# Patient Record
Sex: Female | Born: 2000 | Race: White | Hispanic: No | Marital: Single | Smoking: Never smoker
Health system: Southern US, Community
[De-identification: ages and names within clinical notes are randomized; demographics above are authoritative.]

---

## 2020-01-21 DIAGNOSIS — J36 Peritonsillar abscess: Secondary | ICD-10-CM

## 2020-01-21 HISTORY — DX: Peritonsillar abscess: J36

## 2020-03-18 ENCOUNTER — Emergency Department: Payer: Medicaid - Out of State

## 2020-03-18 ENCOUNTER — Encounter: Payer: Self-pay | Admitting: Emergency Medicine

## 2020-03-18 ENCOUNTER — Emergency Department
Admission: EM | Admit: 2020-03-18 | Discharge: 2020-03-18 | Disposition: A | Discharge status: Home / Self Care | Payer: Medicaid - Out of State | Attending: Emergency Medicine | Admitting: Emergency Medicine

## 2020-03-18 ENCOUNTER — Other Ambulatory Visit: Payer: Self-pay

## 2020-03-18 DIAGNOSIS — J36 Peritonsillar abscess: Secondary | ICD-10-CM | POA: Diagnosis not present

## 2020-03-18 DIAGNOSIS — Z20822 Contact with and (suspected) exposure to covid-19: Secondary | ICD-10-CM | POA: Insufficient documentation

## 2020-03-18 DIAGNOSIS — J029 Acute pharyngitis, unspecified: Secondary | ICD-10-CM | POA: Diagnosis present

## 2020-03-18 DIAGNOSIS — R059 Cough, unspecified: Secondary | ICD-10-CM

## 2020-03-18 LAB — COMPREHENSIVE METABOLIC PANEL
ALT: 17 U/L (ref 0–44)
AST: 20 U/L (ref 15–41)
Albumin: 4.8 g/dL (ref 3.5–5.0)
Alkaline Phosphatase: 95 U/L (ref 38–126)
Anion gap: 12 (ref 5–15)
BUN: 5 mg/dL — ABNORMAL LOW (ref 6–20)
CO2: 25 mmol/L (ref 22–32)
Calcium: 9.8 mg/dL (ref 8.9–10.3)
Chloride: 101 mmol/L (ref 98–111)
Creatinine, Ser: 0.75 mg/dL (ref 0.44–1.00)
GFR, Estimated: >60 mL/min (ref >60)
Glucose, Bld: 126 mg/dL — ABNORMAL HIGH (ref 70–99)
Potassium: 3.4 mmol/L — ABNORMAL LOW (ref 3.5–5.1)
Sodium: 138 mmol/L (ref 135–145)
Total Bilirubin: 1 mg/dL (ref 0.3–1.2)
Total Protein: 8.5 g/dL — ABNORMAL HIGH (ref 6.5–8.1)

## 2020-03-18 LAB — CBC WITH DIFFERENTIAL/PLATELET
Abs Immature Granulocytes: 0.16 10*3/uL — ABNORMAL HIGH (ref 0.00–0.07)
Basophils Absolute: 0.1 10*3/uL (ref 0.0–0.1)
Basophils Relative: 0 %
Eosinophils Absolute: 0.1 10*3/uL (ref 0.0–0.5)
Eosinophils Relative: 1 %
HCT: 43.5 % (ref 36.0–46.0)
Hemoglobin: 14.5 g/dL (ref 12.0–15.0)
Immature Granulocytes: 1 %
Lymphocytes Relative: 10 %
Lymphs Abs: 1.8 10*3/uL (ref 0.7–4.0)
MCH: 30.5 pg (ref 26.0–34.0)
MCHC: 33.3 g/dL (ref 30.0–36.0)
MCV: 91.6 fL (ref 80.0–100.0)
Monocytes Absolute: 2 10*3/uL — ABNORMAL HIGH (ref 0.1–1.0)
Monocytes Relative: 11 %
Neutro Abs: 14.8 10*3/uL — ABNORMAL HIGH (ref 1.7–7.7)
Neutrophils Relative %: 77 %
Platelets: 316 10*3/uL (ref 150–400)
RBC: 4.75 MIL/uL (ref 3.87–5.11)
RDW: 12.5 % (ref 11.5–15.5)
WBC: 19 10*3/uL — ABNORMAL HIGH (ref 4.0–10.5)
nRBC: 0 % (ref 0.0–0.2)

## 2020-03-18 LAB — GROUP A STREP BY PCR: Group A Strep by PCR: DETECTED — AB

## 2020-03-18 LAB — RESP PANEL BY RT-PCR (FLU A&B, COVID) ARPGX2
Influenza A by PCR: NEGATIVE
Influenza B by PCR: NEGATIVE
SARS Coronavirus 2 by RT PCR: NEGATIVE

## 2020-03-18 LAB — HCG, QUANTITATIVE, PREGNANCY: hCG, Beta Chain, Quant, S: <1 m[IU]/mL (ref <5)

## 2020-03-18 IMAGING — CT CT NECK W/ CM
4 series · 14 of 33 positions shown, 17 images · IV contrast (omnipaque)
Comparison: None.

CLINICAL DATA: Epiglottitis or tonsillitis suspected, pain swelling
left peritonsillar

EXAM:
CT NECK WITH CONTRAST
TECHNIQUE: Multidetector CT imaging of the neck was performed using the
standard protocol following the bolus administration of intravenous
contrast.
CONTRAST:  75mL OMNIPAQUE IOHEXOL 300 MG/ML  SOLN

[Series 2: axial neck · axial · 0.51mm/px · z∈[-266,-108]mm · 5 of 119 slices shown, 7 images]
[im 20/119  soft-tissue]
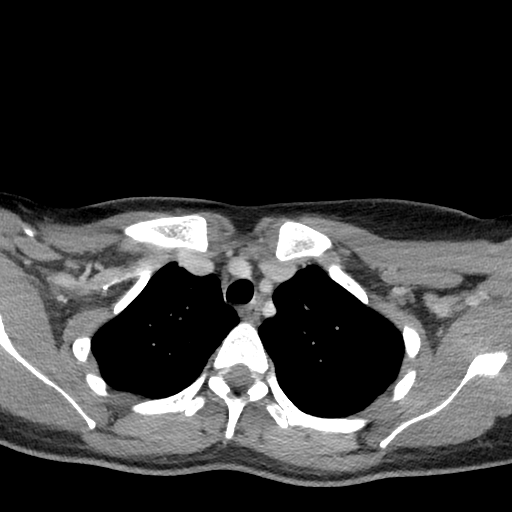
[im 20/119  bone]
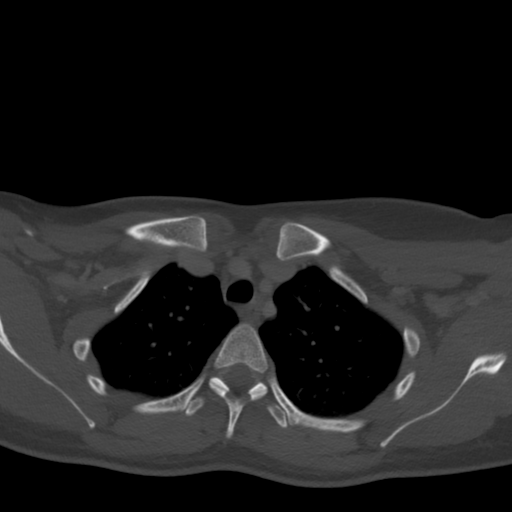
[im 40/119  bone]
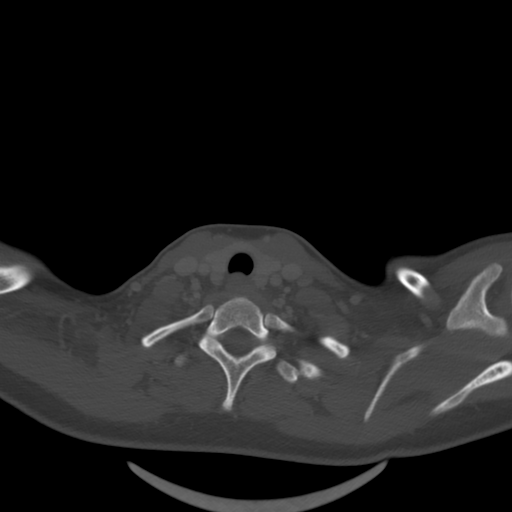
[im 60/119  bone]
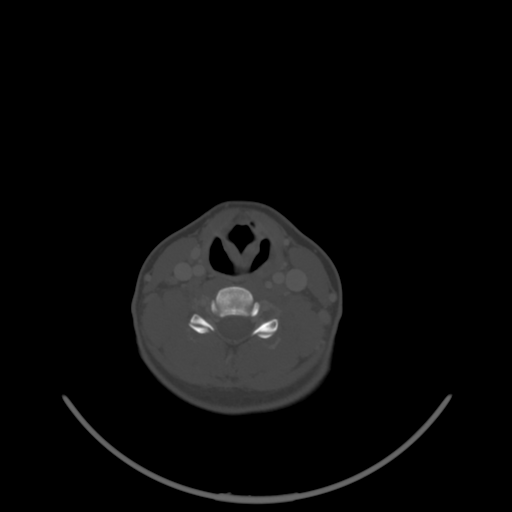
[im 79/119  bone]
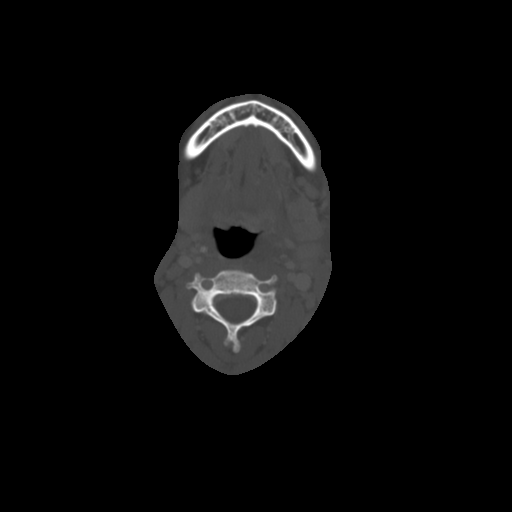
[im 99/119  soft-tissue]
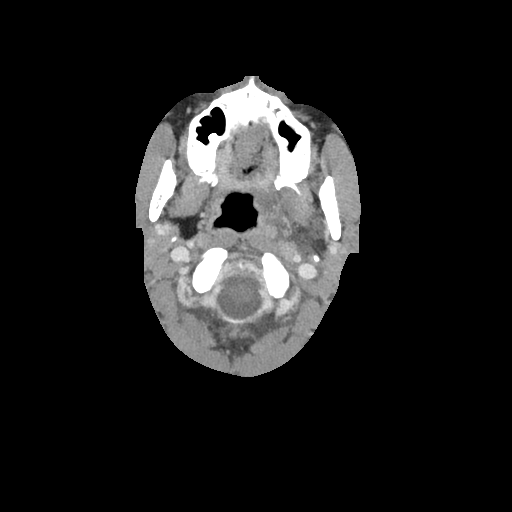
[im 99/119  bone]
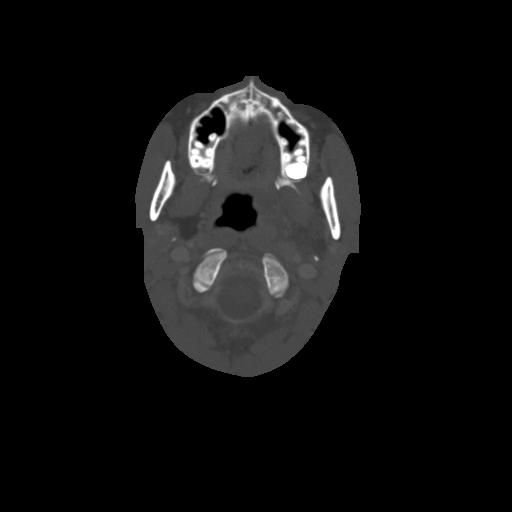

[Series 5: sag neck · sagittal · 0.42mm/px · 5 of 114 slices shown, 6 images]
[im 38/114  bone]
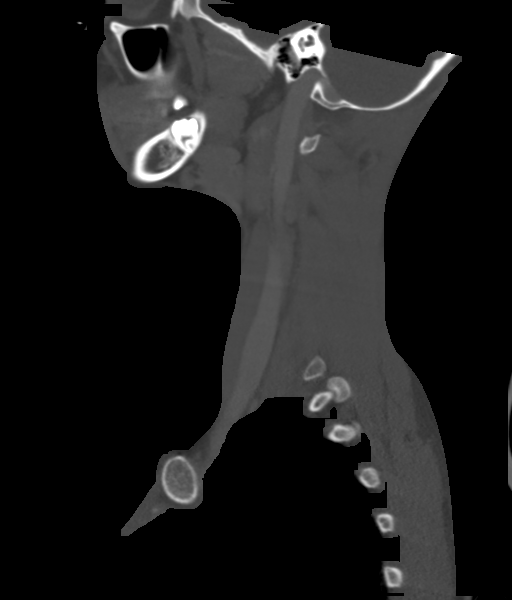
[im 48/114  bone]
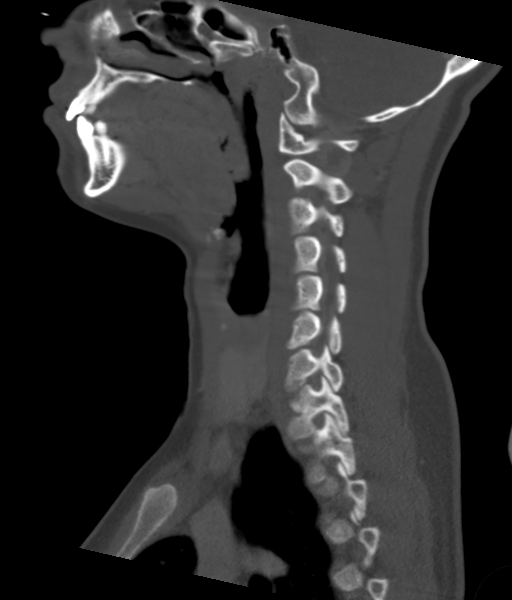
[im 57/114  soft-tissue]
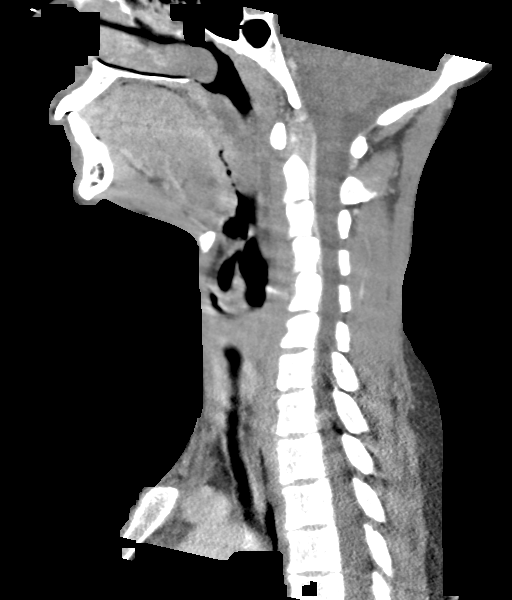
[im 57/114  bone]
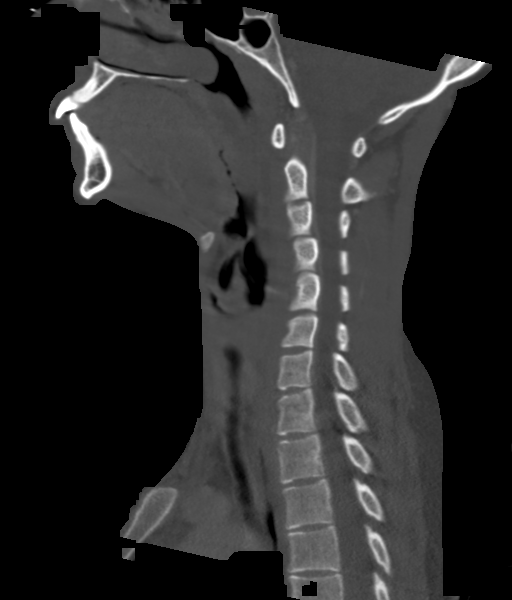
[im 66/114  bone]
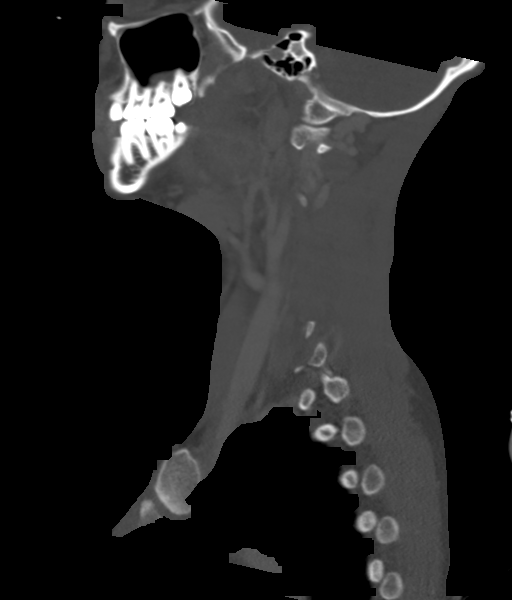
[im 76/114  bone]
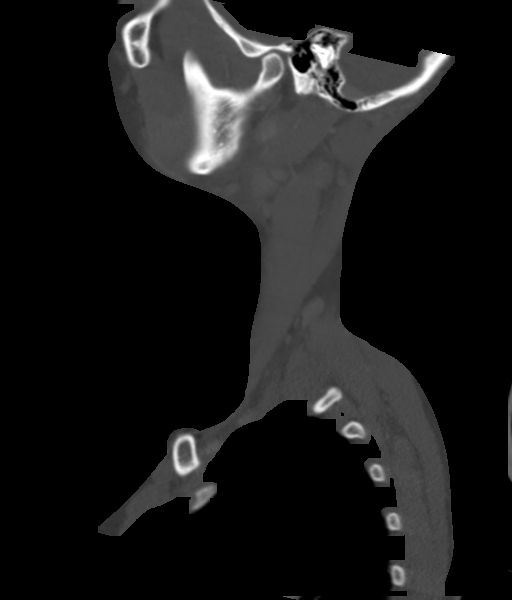

[Series 6: cor neck · coronal · 0.44mm/px · 3 of 91 slices shown]
[im 19/91  bone]
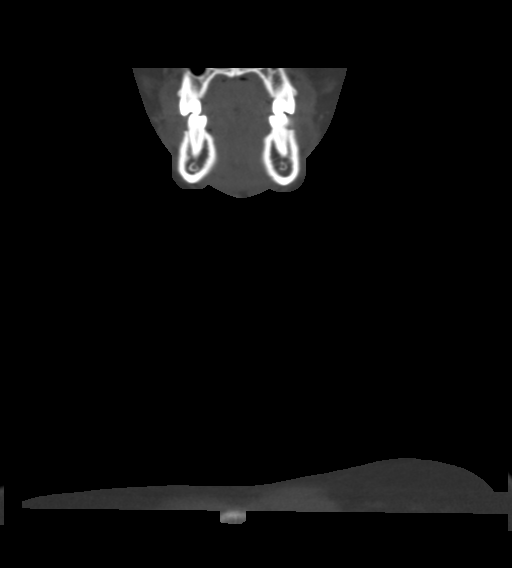
[im 37/91  bone]
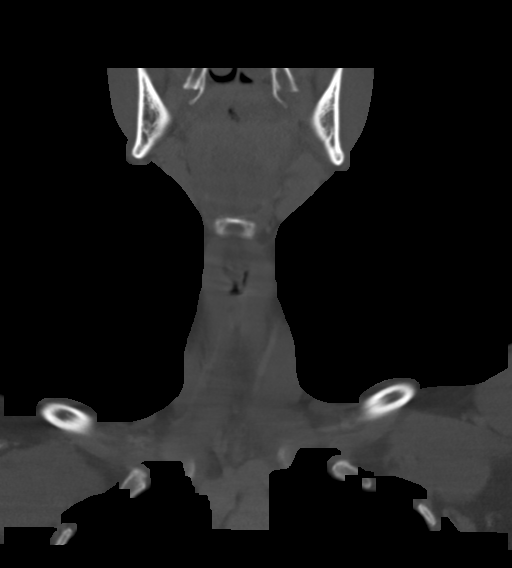
[im 55/91  bone]
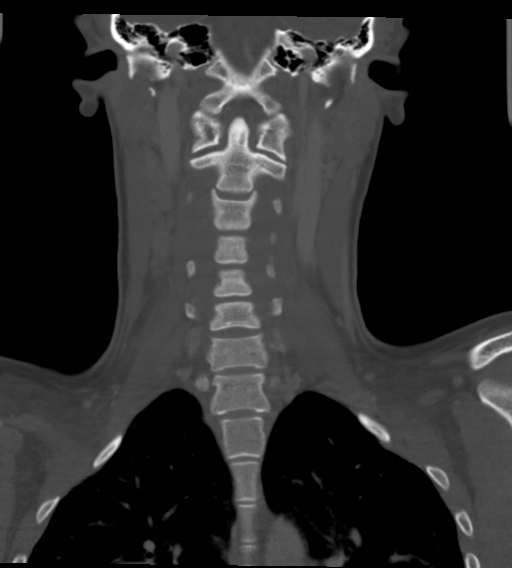

[Series 7: orthogonal ax · axial · 0.35mm/px · 1 of 130 slices shown]
[im 22/130  bone]
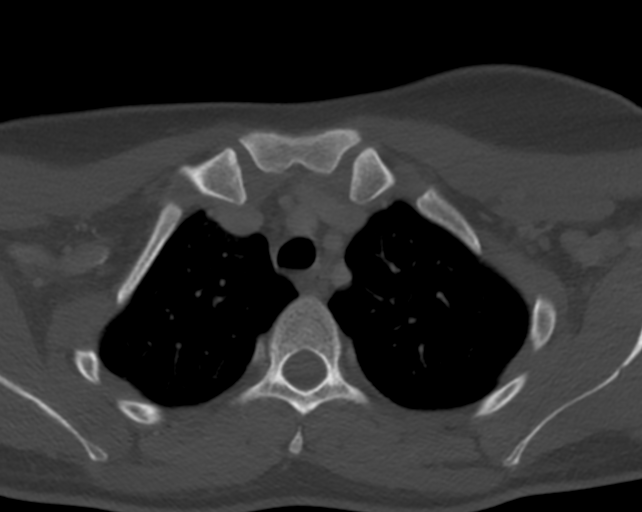

[14 of 33 positions shown; findings below may reference images not displayed]

FINDINGS: Pharynx and larynx: Mild asymmetric prominence/edema involving the
left tubal tonsil and adenoid. Left pharyngeal wall edema. The left
palatine tonsil is enlarged and edematous with an associated abscess
measuring 1.2 x 1.0 x 1.8 cm. Mild left parapharyngeal fat
inflammatory changes. Mild narrowing of the oropharyngeal airway.

Patent vallecula and piriform sinuses. No evidence of vocal cord
paralysis. Normal epiglottis.

Salivary glands: Normal appearance of the parotid and submandibular
glands. Left neck inflammatory changes underlying the platysma
muscle

Thyroid: Normal.

Lymph nodes: Asymmetrically enlarged left cervical nodes measuring
up to 1.3 x 1.9 cm in the 2A region.

Vascular: Normal intravascular enhancement.

Limited intracranial: Grossly unremarkable.

Visualized orbits: Normal orbits.

Mastoids and visualized paranasal sinuses: Left maxillary sinus
mucous retention cyst. No mastoid effusion.

Skeleton: No acute or suspicious osseous abnormalities.

Upper chest: Clear lung apices.

Other: None.
IMPRESSION: Left palatine tonsillitis with 1.8 cm peritonsillar abscess.

Mild reactive inflammatory changes involving the left adenoid and
tubal tonsil.

Left pharyngeal wall/neck edema and left cervical adenopathy, likely
reactive.

## 2020-03-18 MED ORDER — CLINDAMYCIN HCL 150 MG PO CAPS: 450.0000 mg | ORAL_CAPSULE | Freq: Four times a day (QID) | ORAL | 0 refills | Status: AC

## 2020-03-18 MED ORDER — BENZOCAINE 20 % MT SOLN
Freq: Once | OROMUCOSAL | Status: AC
  Filled 2020-03-18: qty 30

## 2020-03-18 MED ORDER — MORPHINE SULFATE (PF) 2 MG/ML IV SOLN
2.0000 mg | Freq: Once | INTRAVENOUS | Status: AC
  Administered 2020-03-18: 2 mg via INTRAVENOUS
  Filled 2020-03-18: qty 1

## 2020-03-18 MED ORDER — SODIUM CHLORIDE 0.9 % IV BOLUS
1000.0000 mL | Freq: Once | INTRAVENOUS | Status: AC
  Administered 2020-03-18: 1000 mL via INTRAVENOUS

## 2020-03-18 MED ORDER — CLINDAMYCIN PHOSPHATE 600 MG/50ML IV SOLN
600.0000 mg | Freq: Once | INTRAVENOUS | Status: AC
  Administered 2020-03-18: 600 mg via INTRAVENOUS
  Filled 2020-03-18: qty 50

## 2020-03-18 MED ORDER — KETOROLAC TROMETHAMINE 30 MG/ML IJ SOLN
15.0000 mg | Freq: Once | INTRAMUSCULAR | Status: AC
  Administered 2020-03-18: 15 mg via INTRAVENOUS
  Filled 2020-03-18: qty 1

## 2020-03-18 MED ORDER — DEXAMETHASONE SODIUM PHOSPHATE 10 MG/ML IJ SOLN
10.0000 mg | Freq: Once | INTRAMUSCULAR | Status: AC
  Administered 2020-03-18: 10 mg via INTRAVENOUS
  Filled 2020-03-18: qty 1

## 2020-03-18 MED ORDER — IOHEXOL 300 MG/ML  SOLN
75.0000 mL | Freq: Once | INTRAMUSCULAR | Status: AC | PRN
  Administered 2020-03-18: 75 mL via INTRAVENOUS
  Filled 2020-03-18: qty 75

## 2020-03-18 MED ORDER — PREDNISONE 10 MG PO TABS: 10.0000 mg | ORAL_TABLET | Freq: Every day | ORAL | 0 refills | Status: AC

## 2020-03-18 MED ORDER — LIDOCAINE-EPINEPHRINE 2 %-1:100000 IJ SOLN
20.0000 mL | Freq: Once | INTRAMUSCULAR | Status: AC
  Administered 2020-03-18: 20 mL
  Filled 2020-03-18: qty 1

## 2020-03-18 NOTE — ED Provider Notes (Signed)
Medical screening examination/treatment/procedure(s) were conducted as a shared visit with non-physician practitioner(s) and myself.  I personally evaluated the patient during the encounter.     DG Chest 2 View  Result Date: 03/18/2020 CLINICAL DATA:  Sore throat and cough x1 week. EXAM: CHEST - 2 VIEW COMPARISON:  None. FINDINGS: The heart size and mediastinal contours are within normal limits. No focal consolidation. No pleural effusion. No pneumothorax. The visualized skeletal structures are unremarkable. IMPRESSION: No acute cardiopulmonary disease. Electronically Signed   By: Maudry Mayhew MD   On: 03/18/2020 11:19   CT Soft Tissue Neck W Contrast  Result Date: 03/18/2020 CLINICAL DATA:  Epiglottitis or tonsillitis suspected, pain swelling left peritonsillar EXAM: CT NECK WITH CONTRAST TECHNIQUE: Multidetector CT imaging of the neck was performed using the standard protocol following the bolus administration of intravenous contrast. CONTRAST:  81mL OMNIPAQUE IOHEXOL 300 MG/ML  SOLN COMPARISON:  None. FINDINGS: Pharynx and larynx: Mild asymmetric prominence/edema involving the left tubal tonsil and adenoid. Left pharyngeal wall edema. The left palatine tonsil is enlarged and edematous with an associated abscess measuring 1.2 x 1.0 x 1.8 cm. Mild left parapharyngeal fat inflammatory changes. Mild narrowing of the oropharyngeal airway. Patent vallecula and piriform sinuses. No evidence of vocal cord paralysis. Normal epiglottis. Salivary glands: Normal appearance of the parotid and submandibular glands. Left neck inflammatory changes underlying the platysma muscle Thyroid: Normal. Lymph nodes: Asymmetrically enlarged left cervical nodes measuring up to 1.3 x 1.9 cm in the 2A region. Vascular: Normal intravascular enhancement. Limited intracranial: Grossly unremarkable. Visualized orbits: Normal orbits. Mastoids and visualized paranasal sinuses: Left maxillary sinus mucous retention cyst. No mastoid  effusion. Skeleton: No acute or suspicious osseous abnormalities. Upper chest: Clear lung apices. Other: None. IMPRESSION: Left palatine tonsillitis with 1.8 cm peritonsillar abscess. Mild reactive inflammatory changes involving the left adenoid and tubal tonsil. Left pharyngeal wall/neck edema and left cervical adenopathy, likely reactive. Electronically Signed   By: Stana Bunting M.D.   On: 03/18/2020 12:34     Patient awake fully alert, no distress.  Does report improvement in pain but still some discomfort especially in the posterior throat.  Does report a history of penicillin amoxicillin allergies, thus given clindamycin.  She is fully awake and alert, she has just a slight hoarseness of voice.  No trouble swallowing.  She has no difficulty breathing.  Current plan is to await ENT consultation as abscess identified in the left palate region   Sharyn Creamer, MD 03/18/20 1319

## 2020-03-18 NOTE — Consult Note (Signed)
Betty Welch, Betty Welch [id number] January 09, 2001 Betty MealyPaul S Joey Lierman, MD  Reason for Consult: Peritonsillar abscess Requesting Physician: Sharyn CreamerQuale, Mark, MD Consulting Physician: Betty MealyPaul S Donyale Falcon, MD  HPI: This 20 y.o. year old female was admitted on 03/18/2020 for sob. Sore throat for a week with no prior h/o recurrent tonsillitis. Appeared to worsen last night making it harder to breathe and swallow. Denies fever. CT showed small peritonsillar abscess though looking at the scan could admittedly represent a phlegmon. She feels significantly better after IV Clinda and Decadron, now able to swallow, no breathing issues.  Allergies:  Allergies  Allergen Reactions  . Penicillins Rash    Medications: (Not in a hospital admission) .  No current facility-administered medications for this encounter.   No current outpatient medications on file.    PMH: History reviewed. No pertinent past medical history.  Fam Hx: No family history on file.  Soc Hx:  Social History   Socioeconomic History  . Marital status: Single    Spouse name: Not on file  . Number of children: Not on file  . Years of education: Not on file  . Highest education level: Not on file  Occupational History  . Not on file  Tobacco Use  . Smoking status: Not on file  . Smokeless tobacco: Not on file  Substance and Sexual Activity  . Alcohol use: Not on file  . Drug use: Not on file  . Sexual activity: Not on file  Other Topics Concern  . Not on file  Social History Narrative  . Not on file   Social Determinants of Health   Financial Resource Strain: Not on file  Food Insecurity: Not on file  Transportation Needs: Not on file  Physical Activity: Not on file  Stress: Not on file  Social Connections: Not on file  Intimate Partner Violence: Not on file    PSH: History reviewed. No pertinent surgical history.. Procedures since admission: No admission procedures for hospital encounter.  ROS: Review of systems normal other  than 12 systems except per HPI.  PHYSICAL EXAM Vitals:  Vitals:   03/18/20 1356 03/18/20 1400  BP: 119/85 (!) 123/98  Pulse: 85 (!) 105  Resp: 20   Temp:    SpO2: 98% 100%  .  General: Well-developed, Well-nourished in no acute distress Mood: Mood and affect well adjusted, pleasant and cooperative. Orientation: Grossly alert and oriented. Vocal Quality: No hoarseness. Communicates verbally. head and Face: NCAT. No facial asymmetry. No visible skin lesions. No significant facial scars. No tenderness with sinus percussion. Facial strength normal and symmetric. Ears: External ears with normal landmarks, no lesions. External auditory canals free of infection, cerumen impaction or lesions. Tympanic membranes intact with good landmarks and normal mobility on pneumatic otoscopy. No middle ear effusion. Hearing: Speech reception grossly normal. Nose: External nose normal with midline dorsum and no lesions or deformity. Nasal Cavity reveals essentially midline septum with normal inferior turbinates. No significant mucosal congestion or erythema. Nasal secretions are minimal and clear. No polyps seen on anterior rhinoscopy. Oral Cavity/ Oropharynx: Lips are normal with no lesions. Teeth no frank dental caries. Gingiva healthy with no lesions or gingivitis. Edema/erythema of the left tonsil. Only mild swelling of the adjacent soft palate, and no uvular shift. No mass effect from swelling. Indirect Laryngoscopy/Nasopharyngoscopy: Visualization of the larynx, hypopharynx and nasopharynx is not possible in this setting with routine examination. Neck: Supple and symmetric with no palpable masses, tenderness or crepitance. The trachea is midline. Thyroid gland is soft, nontender and  symmetric with no masses or enlargement. Parotid and submandibular glands are soft, nontender and symmetric, without masses. Lymphatic: Shotty mild lymphadenopathy Respiratory: Normal respiratory effort without labored  breathing. Cardiovascular: Carotid pulse shows regular rate and rhythm Neurologic: Cranial Nerves II through XII are grossly intact. Eyes: Gaze and Ocular Motility are grossly normal. PERRLA. No visible nystagmus.  MEDICAL DECISION MAKING: Data Review:  Results for orders placed or performed during the hospital encounter of 03/18/20 (from the past 48 hour(s))  CBC with Differential     Status: Abnormal   Collection Time: 03/18/20 10:32 AM  Result Value Ref Range   WBC 19.0 (H) 4.0 - 10.5 K/uL   RBC 4.75 3.87 - 5.11 MIL/uL   Hemoglobin 14.5 12.0 - 15.0 g/dL   HCT 50.3 54.6 - 56.8 %   MCV 91.6 80.0 - 100.0 fL   MCH 30.5 26.0 - 34.0 pg   MCHC 33.3 30.0 - 36.0 g/dL   RDW 12.7 51.7 - 00.1 %   Platelets 316 150 - 400 K/uL   nRBC 0.0 0.0 - 0.2 %   Neutrophils Relative % 77 %   Neutro Abs 14.8 (H) 1.7 - 7.7 K/uL   Lymphocytes Relative 10 %   Lymphs Abs 1.8 0.7 - 4.0 K/uL   Monocytes Relative 11 %   Monocytes Absolute 2.0 (H) 0.1 - 1.0 K/uL   Eosinophils Relative 1 %   Eosinophils Absolute 0.1 0.0 - 0.5 K/uL   Basophils Relative 0 %   Basophils Absolute 0.1 0.0 - 0.1 K/uL   Immature Granulocytes 1 %   Abs Immature Granulocytes 0.16 (H) 0.00 - 0.07 K/uL    Comment: Performed at [facility], [address] [place], [place] [address]  Comprehensive metabolic panel     Status: Abnormal   Collection Time: 03/18/20 10:32 AM  Result Value Ref Range   Sodium 138 135 - 145 mmol/L   Potassium 3.4 (L) 3.5 - 5.1 mmol/L   Chloride 101 98 - 111 mmol/L   CO2 25 22 - 32 mmol/L   Glucose, Bld 126 (H) 70 - 99 mg/dL    Comment: Glucose reference range applies only to samples taken after fasting for at least 8 hours.   BUN 5 (L) 6 - 20 mg/dL   Creatinine, Ser 9.67 0.44 - 1.00 mg/dL   Calcium 9.8 8.9 - 59.1 mg/dL   Total Protein 8.5 (H) 6.5 - 8.1 g/dL   Albumin 4.8 3.5 - 5.0 g/dL   AST 20 15 - 41 U/L   ALT 17 0 - 44 U/L   Alkaline Phosphatase 95 38 - 126 U/L   Total Bilirubin 1.0 0.3  - 1.2 mg/dL   GFR, Estimated >63 >84 mL/min    Comment: (NOTE) Calculated using the CKD-EPI Creatinine Equation (2021)    Anion gap 12 5 - 15    Comment: Performed at [facility], [address], [place], [place] [address]  Resp Panel by RT-PCR (Flu A&B, Covid) Nasopharyngeal Swab     Status: None   Collection Time: 03/18/20 10:32 AM   Specimen: Nasopharyngeal Swab; Nasopharyngeal(NP) swabs in vial transport medium  Result Value Ref Range   SARS Coronavirus 2 by RT PCR NEGATIVE NEGATIVE    Comment: (NOTE) SARS-CoV-2 target nucleic acids are NOT DETECTED.  The SARS-CoV-2 RNA is generally detectable in upper respiratory specimens during the acute phase of infection. The lowest concentration of SARS-CoV-2 viral copies this assay can detect is 138 copies/mL. A negative result does not  preclude SARS-Cov-2 infection and should not be used as the sole basis for treatment or other patient management decisions. A negative result may occur with  improper specimen collection/handling, submission of specimen other than nasopharyngeal swab, presence of viral mutation(s) within the areas targeted by this assay, and inadequate number of viral copies(<138 copies/mL). A negative result must be combined with clinical observations, patient history, and epidemiological information. The expected result is Negative.  Fact Sheet for Patients:  BloggerCourse.com  Fact Sheet for Healthcare Providers:  SeriousBroker.it  This test is no t yet approved or cleared by the [place] FDA and  has been authorized for detection and/or diagnosis of SARS-CoV-2 by FDA under an Emergency Use Authorization (EUA). This EUA will remain  in effect (meaning this test can be used) for the duration of the COVID-19 declaration under Section 564(b)(1) of the Act, 21 U.S.C.section 360bbb-3(b)(1), unless the authorization is terminated  or revoked sooner.        Influenza A by PCR NEGATIVE NEGATIVE   Influenza B by PCR NEGATIVE NEGATIVE    Comment: (NOTE) The Xpert Xpress SARS-CoV-2/FLU/RSV plus assay is intended as an aid in the diagnosis of influenza from Nasopharyngeal swab specimens and should not be used as a sole basis for treatment. Nasal washings and aspirates are unacceptable for Xpert Xpress SARS-CoV-2/FLU/RSV testing.  Fact Sheet for Patients: BloggerCourse.com  Fact Sheet for Healthcare Providers: SeriousBroker.it  This test is not yet approved or cleared by the [place] FDA and has been authorized for detection and/or diagnosis of SARS-CoV-2 by FDA under an Emergency Use Authorization (EUA). This EUA will remain in effect (meaning this test can be used) for the duration of the COVID-19 declaration under Section 564(b)(1) of the Act, 21 U.S.C. section 360bbb-3(b)(1), unless the authorization is terminated or revoked.  Performed at [facility], [address] [place], [place] [address]   Group A Strep by PCR [facility] Only)     Status: Abnormal   Collection Time: 03/18/20 10:32 AM   Specimen: Throat; Sterile Swab  Result Value Ref Range   Group A Strep by PCR DETECTED (A) NOT DETECTED    Comment: Performed at [facility], [address] [place], [place] [address]  hCG, quantitative, pregnancy     Status: None   Collection Time: 03/18/20 10:32 AM  Result Value Ref Range   hCG, Beta Chain, Quant, S <1 <5 mIU/mL    Comment:          GEST. AGE      CONC.  (mIU/mL)   <=1 WEEK        5 - 50     2 WEEKS       50 - 500     3 WEEKS       100 - 10,000     4 WEEKS     1,000 - 30,000     5 WEEKS     3,500 - 115,000   6-8 WEEKS     12,000 - 270,000    12 WEEKS     15,000 - 220,000        FEMALE AND NON-PREGNANT FEMALE:     LESS THAN 5 mIU/mL Performed at [facility], [address] [place], [place] [address]   . DG Chest 2 View  Result  Date: 03/18/2020 CLINICAL DATA:  Sore throat and cough x1 week. EXAM: CHEST - 2 VIEW COMPARISON:  None. FINDINGS: The heart size and mediastinal contours are within normal  limits. No focal consolidation. No pleural effusion. No pneumothorax. The visualized skeletal structures are unremarkable. IMPRESSION: No acute cardiopulmonary disease. Electronically Signed   By: Maudry Mayhew MD   On: 03/18/2020 11:19   CT Soft Tissue Neck W Contrast  Result Date: 03/18/2020 CLINICAL DATA:  Epiglottitis or tonsillitis suspected, pain swelling left peritonsillar EXAM: CT NECK WITH CONTRAST TECHNIQUE: Multidetector CT imaging of the neck was performed using the standard protocol following the bolus administration of intravenous contrast. CONTRAST:  64mL OMNIPAQUE IOHEXOL 300 MG/ML  SOLN COMPARISON:  None. FINDINGS: Pharynx and larynx: Mild asymmetric prominence/edema involving the left tubal tonsil and adenoid. Left pharyngeal wall edema. The left palatine tonsil is enlarged and edematous with an associated abscess measuring 1.2 x 1.0 x 1.8 cm. Mild left parapharyngeal fat inflammatory changes. Mild narrowing of the oropharyngeal airway. Patent vallecula and piriform sinuses. No evidence of vocal cord paralysis. Normal epiglottis. Salivary glands: Normal appearance of the parotid and submandibular glands. Left neck inflammatory changes underlying the platysma muscle Thyroid: Normal. Lymph nodes: Asymmetrically enlarged left cervical nodes measuring up to 1.3 x 1.9 cm in the 2A region. Vascular: Normal intravascular enhancement. Limited intracranial: Grossly unremarkable. Visualized orbits: Normal orbits. Mastoids and visualized paranasal sinuses: Left maxillary sinus mucous retention cyst. No mastoid effusion. Skeleton: No acute or suspicious osseous abnormalities. Upper chest: Clear lung apices. Other: None. IMPRESSION: Left palatine tonsillitis with 1.8 cm peritonsillar abscess. Mild reactive inflammatory changes involving  the left adenoid and tubal tonsil. Left pharyngeal wall/neck edema and left cervical adenopathy, likely reactive. Electronically Signed   By: Stana Bunting M.D.   On: 03/18/2020 12:34  .   PROCEDURE: Preoperative Diagnosis: Left peritonsillar abscess Postoperative Diagnosis: Same Procedure: Aspiration of left peritonsillar abscess Indications: left peritonsillar abscess Findings:Minimal cloudy mucous and  Scant blood aspirated after 3 passes. No significant purulence isolated.  Description of Procedure: After discussing procedure and risks  (primarily bleeding) with the patient, the throat anesthetized with topical Hurricane spray and the region around the left tonsil injected with 1% lidocaine with epinephrine, 1:100,000. An 18 gage needle was used to aspirate above and lateral to the tonsil, aspirating 3 times in different locations around the superior pole, but no gross purulence was encountered The patient tolerated the procedure well.   ASSESSMENT: Left early peritonsillar abscess, likely more a phlegmon given the dramatic improvement today after IV steroids and Clinda, and lack of any gross purulence on aspiration attempts.  PLAN: Discharge home on a steroid taper and Clinda 300mg  PO QID. F/u at my office Wednesday to recheck, or earlier if symptoms worsen. Tylenol as needed for pain, push fluids.  Thursday, MD 03/18/2020 2:17 PM

## 2020-03-18 NOTE — ED Notes (Signed)
ENT MD fibnished w/ procedure and out of room. Pt swishing and spitting icewater per VO by ENT.

## 2020-03-18 NOTE — ED Provider Notes (Signed)
[facility] EMERGENCY DEPARTMENT Provider Note   CSN: [id number] Arrival date & time: 03/18/20  1004     History Chief Complaint  Patient presents with  . Sore Throat    Betty Welch is a 20 y.o. female presents to the emergency department for evaluation of severe sore throat.  Patient states she has had a mild sore throat for [redacted] week along with a cough.  She denies any fevers chills body aches nausea vomiting diarrhea.  No chest pain shortness of breath or abdominal pain.  Last night she developed a severe left-sided sore throat with severe pain with swallowing.  She is not taking any medications for pain or inflammation today.  She went to urgent care facility and was recommended to go to the ER due to signs of peritonsillar abscess.  Is tolerating p.o. fluids well but states she would have a hard time swallowing pills.  She denies any productive cough.  Patient has been fully vaccinated and boosters for Covid.  HPI     History reviewed. No pertinent past medical history.  There are no problems to display for this patient.   History reviewed. No pertinent surgical history.   OB History   No obstetric history on file.     No family history on file.     Home Medications Prior to Admission medications   Medication Sig Start Date End Date Taking? Authorizing Provider  clindamycin (CLEOCIN) 150 MG capsule Take 3 capsules (450 mg total) by mouth every 6 (six) hours for 14 days. 03/18/20 04/01/20 Yes Evon Slack, PA-C  predniSONE (DELTASONE) 10 MG tablet Take 1 tablet (10 mg total) by mouth daily. 6,5,4,3,2,1 six day taper 03/18/20  Yes Evon Slack, PA-C    Allergies    Penicillins  Review of Systems   Review of Systems  Constitutional: Negative for chills and fever.  HENT: Positive for sore throat and trouble swallowing.   Respiratory: Positive for cough. Negative for choking, shortness of breath, wheezing and stridor.   Cardiovascular:  Negative for chest pain.  Gastrointestinal: Negative for abdominal pain, nausea and vomiting.  Musculoskeletal: Negative for arthralgias and back pain.  Skin: Negative for rash and wound.  Neurological: Negative for dizziness, light-headedness, numbness and headaches.    Physical Exam Updated Vital Signs BP (!) 123/98   Pulse (!) 105   Temp 98.6 F (37 C) (Oral)   Resp 20   Ht 5\' 3"  (1.6 m)   Wt 56.2 kg   LMP 03/14/2020   SpO2 100%   BMI 21.97 kg/m   Physical Exam Vitals reviewed.  Constitutional:      Appearance: She is well-developed and well-nourished.  HENT:     Head: Normocephalic and atraumatic.     Right Ear: Ear canal normal.     Left Ear: Ear canal normal.     Nose: No congestion or rhinorrhea.     Mouth/Throat:     Mouth: Mucous membranes are moist.     Tonsils: Tonsillar abscess present. No tonsillar exudate.     Comments: Uvula shifted to the right, large left peritonsillar swelling.  No right-sided peritonsillar swelling.  Erythema noted but no visible exudates.  Patient is tender along the left anterior cervical region.  No trismus or drooling. Eyes:     Conjunctiva/sclera: Conjunctivae normal.  Cardiovascular:     Rate and Rhythm: Normal rate.     Heart sounds: Normal heart sounds.  Pulmonary:     Effort: Pulmonary effort  is normal. No respiratory distress.     Breath sounds: No wheezing or rales.  Musculoskeletal:        General: Normal range of motion.     Cervical back: Normal range of motion.  Skin:    General: Skin is warm.     Findings: No rash.  Neurological:     General: No focal deficit present.     Mental Status: She is alert and oriented to person, place, and time.  Psychiatric:        Mood and Affect: Mood and affect normal.        Behavior: Behavior normal.        Thought Content: Thought content normal.     ED Results / Procedures / Treatments   Labs (all labs ordered are listed, but only abnormal results are displayed) Labs  Reviewed  GROUP A STREP BY PCR - Abnormal; Notable for the following components:      Result Value   Group A Strep by PCR DETECTED (*)    All other components within normal limits  CBC WITH DIFFERENTIAL/PLATELET - Abnormal; Notable for the following components:   WBC 19.0 (*)    Neutro Abs 14.8 (*)    Monocytes Absolute 2.0 (*)    Abs Immature Granulocytes 0.16 (*)    All other components within normal limits  COMPREHENSIVE METABOLIC PANEL - Abnormal; Notable for the following components:   Potassium 3.4 (*)    Glucose, Bld 126 (*)    BUN 5 (*)    Total Protein 8.5 (*)    All other components within normal limits  RESP PANEL BY RT-PCR (FLU A&B, COVID) ARPGX2  HCG, QUANTITATIVE, PREGNANCY  POC URINE PREG, ED    EKG None  Radiology DG Chest 2 View  Result Date: 03/18/2020 CLINICAL DATA:  Sore throat and cough x1 week. EXAM: CHEST - 2 VIEW COMPARISON:  None. FINDINGS: The heart size and mediastinal contours are within normal limits. No focal consolidation. No pleural effusion. No pneumothorax. The visualized skeletal structures are unremarkable. IMPRESSION: No acute cardiopulmonary disease. Electronically Signed   By: Maudry Mayhew MD   On: 03/18/2020 11:19   CT Soft Tissue Neck W Contrast  Result Date: 03/18/2020 CLINICAL DATA:  Epiglottitis or tonsillitis suspected, pain swelling left peritonsillar EXAM: CT NECK WITH CONTRAST TECHNIQUE: Multidetector CT imaging of the neck was performed using the standard protocol following the bolus administration of intravenous contrast. CONTRAST:  20mL OMNIPAQUE IOHEXOL 300 MG/ML  SOLN COMPARISON:  None. FINDINGS: Pharynx and larynx: Mild asymmetric prominence/edema involving the left tubal tonsil and adenoid. Left pharyngeal wall edema. The left palatine tonsil is enlarged and edematous with an associated abscess measuring 1.2 x 1.0 x 1.8 cm. Mild left parapharyngeal fat inflammatory changes. Mild narrowing of the oropharyngeal airway. Patent  vallecula and piriform sinuses. No evidence of vocal cord paralysis. Normal epiglottis. Salivary glands: Normal appearance of the parotid and submandibular glands. Left neck inflammatory changes underlying the platysma muscle Thyroid: Normal. Lymph nodes: Asymmetrically enlarged left cervical nodes measuring up to 1.3 x 1.9 cm in the 2A region. Vascular: Normal intravascular enhancement. Limited intracranial: Grossly unremarkable. Visualized orbits: Normal orbits. Mastoids and visualized paranasal sinuses: Left maxillary sinus mucous retention cyst. No mastoid effusion. Skeleton: No acute or suspicious osseous abnormalities. Upper chest: Clear lung apices. Other: None. IMPRESSION: Left palatine tonsillitis with 1.8 cm peritonsillar abscess. Mild reactive inflammatory changes involving the left adenoid and tubal tonsil. Left pharyngeal wall/neck edema and left cervical adenopathy,  likely reactive. Electronically Signed   By: Stana Bunting M.D.   On: 03/18/2020 12:34    Procedures Procedures   Medications Ordered in ED Medications  sodium chloride 0.9 % bolus 1,000 mL (0 mLs Intravenous Stopped 03/18/20 1254)  morphine 2 MG/ML injection 2 mg (2 mg Intravenous Given 03/18/20 1042)  dexamethasone (DECADRON) injection 10 mg (10 mg Intravenous Given 03/18/20 1039)  clindamycin (CLEOCIN) IVPB 600 mg (0 mg Intravenous Stopped 03/18/20 1254)  iohexol (OMNIPAQUE) 300 MG/ML solution 75 mL (75 mLs Intravenous Contrast Given 03/18/20 1212)  ketorolac (TORADOL) 30 MG/ML injection 15 mg (15 mg Intravenous Given 03/18/20 1324)  benzocaine (HURRICAINE) 20 % mouth spray ( Mouth/Throat Given by Other 03/18/20 1355)  lidocaine-EPINEPHrine (XYLOCAINE W/EPI) 2 %-1:100000 (with pres) injection 20 mL (20 mLs Infiltration Given 03/18/20 1348)    ED Course  I have reviewed the triage vital signs and the nursing notes.  Pertinent labs & imaging results that were available during my care of the patient were reviewed by me  and considered in my medical decision making (see chart for details).    MDM Rules/Calculators/A&P                          20 year old female with left peritonsillar abscess.  Patient given IV fluids, Toradol, dexamethasone as well as IV clindamycin.  Throughout her stay while proceeding with imaging patient's pain and swelling significantly improved.  She is able tolerate p.o. well.  Vital signs are stable.  Discussed CT of the neck with ENT who came into the emergency department and attempted drainage of the left peritonsillar abscess region.  Patient tolerated the procedure well.  It was recommended by ENT for patient to continue with clindamycin, prednisone taper and to follow-up with him on 03/21/2020.  Patient will follow-up next week for recheck.  She understands signs symptoms return to the ER for. Final Clinical Impression(s) / ED Diagnoses Final diagnoses:  Peritonsillar abscess  Cough    Rx / DC Orders ED Discharge Orders         Ordered    clindamycin (CLEOCIN) 150 MG capsule  Every 6 hours        03/18/20 1418    predniSONE (DELTASONE) 10 MG tablet  Daily        03/18/20 1418           Ronnette Juniper 03/18/20 1441    Sharyn Creamer, MD 03/18/20 1541

## 2020-03-18 NOTE — ED Notes (Signed)
ENT @ [facility].

## 2020-03-18 NOTE — Discharge Instructions (Addendum)
Please take antibiotics as prescribed.  No further blood clot take prednisone taper as prescribed.  Make sure you are drinking lots of fluids.  Please follow-up with Dr. Willeen Cass on 03/21/2020.  Return to the ER for any increased pain swelling warmth redness or inability to swallow.

## 2020-03-18 NOTE — ED Notes (Signed)
Pt to ED via POV, pt c/o sore throat and cough for approx 1 week. Pt with noted hoarse voice at this time, c/o increased pain with swallowing. Pt able to maintain her own secretions at this time. Pt noted to be tearful and anxious on assessment. Medications administered, blood work and swabs collected as ordered. Pt taken to X-ray.

## 2020-03-18 NOTE — ED Triage Notes (Signed)
Pt reports woke up last pm with a sore throat and swelling. Pt states went to UC and was told her left tonsil was swollen and to come to the ED. Pt able to speak in full sentences, no distress noted

## 2020-04-25 ENCOUNTER — Other Ambulatory Visit: Payer: Self-pay

## 2020-04-25 ENCOUNTER — Ambulatory Visit
Admission: EM | Admit: 2020-04-25 | Discharge: 2020-04-25 | Disposition: A | Discharge status: Home / Self Care | Payer: PRIVATE HEALTH INSURANCE | Attending: Family Medicine | Admitting: Family Medicine

## 2020-04-25 ENCOUNTER — Encounter: Payer: Self-pay | Admitting: Emergency Medicine

## 2020-04-25 DIAGNOSIS — J029 Acute pharyngitis, unspecified: Secondary | ICD-10-CM | POA: Diagnosis present

## 2020-04-25 LAB — POCT RAPID STREP A (OFFICE): Rapid Strep A Screen: NEGATIVE

## 2020-04-25 MED ORDER — CLINDAMYCIN HCL 300 MG PO CAPS: 300.0000 mg | ORAL_CAPSULE | Freq: Three times a day (TID) | ORAL | 0 refills | Status: AC

## 2020-04-25 MED ORDER — FLUCONAZOLE 150 MG PO TABS: 150.0000 mg | ORAL_TABLET | Freq: Once | ORAL | 0 refills | Status: AC

## 2020-04-25 NOTE — ED Triage Notes (Signed)
Patient c/o sore throat x 2 days.   Patient denies fever.   Patient endorses nasal congestion with "spiting up clearish phlegm".   Patient endorses painful swallowing and increased pain on the LFT side.   Patient endorses a previous episode of peritonsillar abscess in February that warranted an ED visit. Patient has followed up with ENT doctor.   Patient has taken OTC cold syrup and Tylenol with no relief of symptoms.

## 2020-04-25 NOTE — ED Provider Notes (Signed)
Renaldo Fiddler    CSN: [id number] Arrival date & time: 04/25/20  0806      History   Chief Complaint Chief Complaint  Patient presents with  . Sore Throat    HPI Betty Welch is a 20 y.o. female.   HPI  Patient presents for evaluation of sore throat. In February patient had a mild sore throat that progressed into peritonsillar abscess requiring hospital management. Low grade fever on arrival today.  Current symptoms present x 2 days. Endorses associated nasal congestion. Management of symptom at home with tylenol and cough medication without relief of symptoms    Past Medical History:  Diagnosis Date  . Peritonsillar abscess 2022    There are no problems to display for this patient.   History reviewed. No pertinent surgical history.  OB History   No obstetric history on file.      Home Medications    Prior to Admission medications   Medication Sig Start Date End Date Taking? Authorizing Provider  predniSONE (DELTASONE) 10 MG tablet Take 1 tablet (10 mg total) by mouth daily. 6,5,4,3,2,1 six day taper 03/18/20   Ronnette Juniper    Family History History reviewed. No pertinent family history.  Social History Social History   Tobacco Use  . Smoking status: Never Smoker  . Smokeless tobacco: Never Used  Vaping Use  . Vaping Use: Some days  . Substances: Nicotine     Allergies   Penicillins   Review of Systems Review of Systems Pertinent negatives listed in HPI  Physical Exam Triage Vital Signs ED Triage Vitals  Enc Vitals Group     BP 04/25/20 0815 101/63     Pulse Rate 04/25/20 0815 (!) 117     Resp 04/25/20 0815 18     Temp 04/25/20 0815 99.3 F (37.4 C)     Temp Source 04/25/20 0815 Oral     SpO2 04/25/20 0815 98 %     Weight --      Height --      Head Circumference --      Peak Flow --      Pain Score 04/25/20 0819 4     Pain Loc --      Pain Edu? --      Excl. in GC? --    No data found.  Updated Vital  Signs BP 101/63 (BP Location: Left Arm)   Pulse (!) 117   Temp 99.3 F (37.4 C) (Oral)   Resp 18   LMP 04/11/2020   SpO2 98%   Visual Acuity Right Eye Distance:   Left Eye Distance:   Bilateral Distance:    Right Eye Near:   Left Eye Near:    Bilateral Near:     Physical Exam  General Appearance:    Alert, cooperative, no distress  HENT:   Normocephalic, ears normal, nares mucosal edema with congestion, O/P erythema w/ white exudate with tonsillar +3   Eyes:    PERRL, conjunctiva/corneas clear, EOM's intact       Lungs:     Clear to auscultation bilaterally, respirations unlabored  Heart:    Regular rate and rhythm  Neurologic:   Awake, alert, oriented x 3. No apparent focal neurological           defect.      UC Treatments / Results  Labs (all labs ordered are listed, but only abnormal results are displayed) Labs Reviewed  POCT RAPID STREP A (OFFICE)  EKG   Radiology No results found.  Procedures Procedures (including critical care time)  Medications Ordered in UC Medications - No data to display  Initial Impression / Assessment and Plan / UC Course  I have reviewed the triage vital signs and the nursing notes.  Pertinent labs & imaging results that were available during my care of the patient were reviewed by me and considered in my medical decision making (see chart for details).    Rapid strep negative. Patient reports prior similar throat infection resolved with Clindamycin, will cover pharyngitis with a brief course of clindamycin. Fluconazole for yeast infection prophylaxis . ER precautions given. PCP follow-up as needed. Throat culture pending Final Clinical Impressions(s) / UC Diagnoses   Final diagnoses:  Acute pharyngitis, unspecified etiology   Discharge Instructions   None    ED Prescriptions    Medication Sig Dispense Auth. Provider   clindamycin (CLEOCIN) 300 MG capsule Take 1 capsule (300 mg total) by mouth 3 (three) times daily for  7 days. 21 capsule Bing Neighbors, FNP   fluconazole (DIFLUCAN) 150 MG tablet Take 1 tablet (150 mg total) by mouth once for 1 dose. Repeat if needed 1 tablet Bing Neighbors, FNP     PDMP not reviewed this encounter.   Bing Neighbors, FNP 04/26/20 1319

## 2020-04-26 LAB — CULTURE, GROUP A STREP (THRC)

## 2020-04-27 ENCOUNTER — Telehealth (HOSPITAL_COMMUNITY): Payer: Self-pay | Admitting: Emergency Medicine

## 2020-04-27 MED ORDER — AZITHROMYCIN 250 MG PO TABS: 250.0000 mg | ORAL_TABLET | Freq: Every day | ORAL | 0 refills | Status: AC
